# Patient Record
Sex: Male | Born: 1980 | Race: White | Hispanic: No | Marital: Married | Smoking: Current every day smoker
Health system: Southern US, Community
[De-identification: ages and names within clinical notes are randomized; demographics above are authoritative.]

## PROBLEM LIST (undated history)

## (undated) DIAGNOSIS — R079 Chest pain, unspecified: Secondary | ICD-10-CM

## (undated) DIAGNOSIS — E785 Hyperlipidemia, unspecified: Secondary | ICD-10-CM

## (undated) DIAGNOSIS — K219 Gastro-esophageal reflux disease without esophagitis: Secondary | ICD-10-CM

## (undated) DIAGNOSIS — R5383 Other fatigue: Secondary | ICD-10-CM

## (undated) HISTORY — DX: Gastro-esophageal reflux disease without esophagitis: K21.9

## (undated) HISTORY — DX: Other fatigue: R53.83

## (undated) HISTORY — DX: Hyperlipidemia, unspecified: E78.5

## (undated) HISTORY — PX: APPENDECTOMY: SHX54

## (undated) HISTORY — DX: Chest pain, unspecified: R07.9

---

## 2008-03-12 ENCOUNTER — Ambulatory Visit (HOSPITAL_COMMUNITY): Admission: RE | Admit: 2008-03-12 | Discharge: 2008-03-12 | Payer: Self-pay | Admitting: Internal Medicine

## 2008-03-13 ENCOUNTER — Inpatient Hospital Stay (HOSPITAL_COMMUNITY): Admission: RE | Admit: 2008-03-13 | Discharge: 2008-03-15 | Payer: Self-pay | Admitting: Orthopedic Surgery

## 2008-03-13 HISTORY — PX: OTHER SURGICAL HISTORY: SHX169

## 2008-06-26 ENCOUNTER — Ambulatory Visit (HOSPITAL_COMMUNITY): Admission: RE | Admit: 2008-06-26 | Discharge: 2008-06-26 | Payer: Self-pay | Admitting: Family Medicine
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
## 2011-05-09 LAB — CBC
HCT: 43
RBC: 4.65
RDW: 13.1

## 2011-05-09 LAB — BASIC METABOLIC PANEL
BUN: 7
CO2: 28
Chloride: 104
Creatinine, Ser: 0.79
Glucose, Bld: 89

## 2014-02-28 ENCOUNTER — Other Ambulatory Visit (HOSPITAL_COMMUNITY): Payer: Self-pay | Admitting: Orthopedic Surgery

## 2014-02-28 DIAGNOSIS — M25562 Pain in left knee: Secondary | ICD-10-CM

## 2014-03-02 ENCOUNTER — Ambulatory Visit (HOSPITAL_COMMUNITY)
Admission: RE | Admit: 2014-03-02 | Discharge: 2014-03-02 | Disposition: A | Discharge status: Home / Self Care | Payer: 59 | Source: Ambulatory Visit | Attending: Orthopedic Surgery | Admitting: Orthopedic Surgery

## 2014-03-02 DIAGNOSIS — R609 Edema, unspecified: Secondary | ICD-10-CM | POA: Insufficient documentation

## 2014-03-02 DIAGNOSIS — M25562 Pain in left knee: Secondary | ICD-10-CM

## 2014-03-02 DIAGNOSIS — M25569 Pain in unspecified knee: Secondary | ICD-10-CM | POA: Insufficient documentation

## 2018-07-15 ENCOUNTER — Other Ambulatory Visit (HOSPITAL_COMMUNITY): Payer: Self-pay | Admitting: Internal Medicine

## 2018-07-15 ENCOUNTER — Ambulatory Visit (HOSPITAL_COMMUNITY)
Admission: RE | Admit: 2018-07-15 | Discharge: 2018-07-15 | Disposition: A | Discharge status: Home / Self Care | Payer: BLUE CROSS/BLUE SHIELD | Source: Ambulatory Visit | Attending: Internal Medicine | Admitting: Internal Medicine

## 2018-07-15 DIAGNOSIS — R079 Chest pain, unspecified: Secondary | ICD-10-CM | POA: Insufficient documentation

## 2018-08-13 ENCOUNTER — Other Ambulatory Visit (HOSPITAL_COMMUNITY)
Admission: RE | Admit: 2018-08-13 | Discharge: 2018-08-13 | Disposition: A | Discharge status: Home / Self Care | Payer: BLUE CROSS/BLUE SHIELD | Source: Ambulatory Visit | Attending: Internal Medicine | Admitting: Internal Medicine

## 2018-08-13 ENCOUNTER — Ambulatory Visit (INDEPENDENT_AMBULATORY_CARE_PROVIDER_SITE_OTHER): Payer: BLUE CROSS/BLUE SHIELD | Admitting: Internal Medicine

## 2018-08-13 ENCOUNTER — Encounter: Payer: Self-pay | Admitting: Internal Medicine

## 2018-08-13 ENCOUNTER — Ambulatory Visit (INDEPENDENT_AMBULATORY_CARE_PROVIDER_SITE_OTHER)
Admission: RE | Admit: 2018-08-13 | Discharge: 2018-08-13 | Disposition: A | Discharge status: Home / Self Care | Payer: BLUE CROSS/BLUE SHIELD | Source: Ambulatory Visit | Attending: Internal Medicine | Admitting: Internal Medicine

## 2018-08-13 VITALS — BP 116/76 | HR 97 | Ht 72.0 in | Wt 201.0 lb

## 2018-08-13 DIAGNOSIS — R55 Syncope and collapse: Secondary | ICD-10-CM | POA: Insufficient documentation

## 2018-08-13 DIAGNOSIS — Z720 Tobacco use: Secondary | ICD-10-CM

## 2018-08-13 DIAGNOSIS — E782 Mixed hyperlipidemia: Secondary | ICD-10-CM

## 2018-08-13 LAB — LIPID PANEL
CHOL/HDL RATIO: 6 ratio
Cholesterol: 282 mg/dL — ABNORMAL HIGH (ref 0–200)
HDL: 47 mg/dL (ref >40)
LDL CALC: 177 mg/dL — AB (ref 0–99)
Triglycerides: 291 mg/dL — ABNORMAL HIGH (ref <150)
VLDL: 58 mg/dL — AB (ref 0–40)

## 2018-08-13 NOTE — Progress Notes (Signed)
Cardiology Office Note   Date:  08/13/2018   ID:  [name], DOB November 19, 1980, MRN [id number]  PCP:  Elfredia Nevins, MD  Cardiologist:   Dietrich Pates, MD   Pt referred for dizzy spell and dyslipidemia   History of Present Illness: Brent Warner is a 38 y.o. male with a history of tobacco abuse   On Dec 4 he was sitting down to dinner when he had a spell  Sitting down he suddenly felt pain in between shoulder blades  Then became very tired   Blurry vision    Did not have palpitatoins   No SOB   No CP    Did not pass out but felt bad.   Stood up and went to lay down     Father came over and listened to him   BP 135/105     HR 110s to 120    Laid down for a while   Got up later and felt OK    Watched TV and went to bed  Seen by Dr Sherwood Gambler the next day   EKG   NSR with normal conduction intervals   BP OK  Labs done  CBC and CMET OK   Lipids abnormal  LDL 189  HDL 46  Trig 241   Total chol 283        Since that episode the pt has had no furhter spells  He denies CP   He can do yard work without a problem   Not moving as fast as 10 year ago, attributes to tobacco use and not exercising   But no problems day to day or playing with his 2 kids  Smoking 1 to 1.5 ppd since age 45   Quit for 1 year   Started back because of stress       No outpatient medications have been marked as taking for the 08/13/18 encounter (Office Visit) with Pricilla Riffle, MD.     Allergies:   Tetracycline   Past Medical History:  Diagnosis Date  . Chest pain   . Fatigue   . GERD (gastroesophageal reflux disease)   . Hyperlipidemia     Past Surgical History:  Procedure Laterality Date  . APPENDECTOMY    . broken arm  03/13/2008     Social History:  The patient  reports that he has been smoking cigarettes. He has never used smokeless tobacco.   Family History:  The patient's family history includes Hypertension in his father.    ROS:  Please see the history of present illness. All other  systems are reviewed and  Negative to the above problem except as noted.    PHYSICAL EXAM: VS:  BP 116/76 (BP Location: Left Arm)   Pulse 97   Ht 6' (1.829 m)   Wt 201 lb (91.2 kg)   SpO2 98%   BMI 27.26 kg/m   GEN: Well nourished, well developed, in no acute distress  HEENT: normal  Neck: no JVD, carotid bruits, or masses Cardiac: RRR; no murmurs, rubs, or gallops,no edema  Respiratory:  clear to auscultation bilaterally, normal work of breathing GI: soft, nontender, nondistended, + BS  No hepatomegaly  MS: no deformity Moving all extremities   Skin: warm and dry, no rash Neuro:  Strength and sensation are intact Psych: euthymic mood, full affect   EKG:  EKG is not  ordered today.  See above    Lipid Panel No results found for: CHOL, TRIG, HDL,  CHOLHDL, VLDL, LDLCALC, LDLDIRECT    Wt Readings from Last 3 Encounters:  08/13/18 201 lb (91.2 kg)      ASSESSMENT AND PLAN:  1 Presyncope    History does not suggest an arrhyhtmia   I am not convinced of anginal equivalent   Sounds like vagal episode in setting of transient pain  WIll perform risk assessment    2   HL  Will repeat labs to verify    3  Tobacco  Counselled on cessation     4   Cardiac risk assessment   Pt with very high lipids and long hx of smoking    WOuld recomm Cardiac CT Calcium score to eval for plaqu burden     F/U based on test results      Current medicines are reviewed at length with the patient today.  The patient does not have concerns regarding medicines.  Signed, Dietrich Pates, MD  08/13/2018 9:30 AM    [facility] [address] [place], [place], [place]  [address] Phone: [phone]; Fax: [phone]

## 2018-08-13 NOTE — Patient Instructions (Signed)
Medication Instructions:  Your physician recommends that you continue on your current medications as directed. Please refer to the Current Medication list given to you today.  If you need a refill on your cardiac medications before your next appointment, please call your pharmacy.   Lab work: Your physician recommends that you return for lab work in: today   If you have labs (blood work) drawn today and your tests are completely normal, you will receive your results only by: Marland Kitchen MyChart Message (if you have MyChart) OR . A paper copy in the mail If you have any lab test that is abnormal or we need to change your treatment, we will call you to review the results.  Testing/Procedures: Non-Cardiac CT scanning, (CAT scanning), is a noninvasive, special x-ray that produces cross-sectional images of the body using x-rays and a computer. CT scans help physicians diagnose and treat medical conditions. For some CT exams, a contrast material is used to enhance visibility in the area of the body being studied. CT scans provide greater clarity and reveal more details than regular x-ray exams.    Follow-Up: At [facility], you and your health needs are our priority.  As part of our continuing mission to provide you with exceptional heart care, we have created designated Provider Care Teams.  These Care Teams include your primary Cardiologist (physician) and Advanced Practice Providers (APPs -  Physician Assistants and Nurse Practitioners) who all work together to provide you with the care you need, when you need it. You will need a follow up appointment pending test results .  Please call our office 2 months in advance to schedule this appointment.  You may see Dietrich Pates, MD or one of the following Advanced Practice Providers on your designated Care Team:   Randall An, PA-C [facility]) . Jacolyn Reedy, PA-C [facility] Office)  Any Other Special Instructions Will Be Listed Below (If  Applicable). Thank you for choosing [facility]!

## 2018-08-16 ENCOUNTER — Telehealth: Payer: Self-pay | Admitting: *Deleted

## 2018-08-16 MED ORDER — ROSUVASTATIN CALCIUM 10 MG PO TABS: 10.0000 mg | ORAL_TABLET | Freq: Every day | ORAL | 3 refills | Status: DC

## 2018-08-16 NOTE — Telephone Encounter (Signed)
-----   Message from Nori Riis, RN sent at 08/16/2018  7:40 AM EST -----  ----- Message ----- From: Pricilla Riffle, MD Sent: 08/13/2018  10:02 PM EST To: Nori Riis, RN  LDL is high at 177 Triglycerides 291 Cut back on saturated fats and sugars Start Crestor 10 mg  I have called this in to [facility] pharmacy Pt will need fasting lipids in 8 wks with AST    Fax results along with CT to Dr Sherwood Gambler

## 2018-08-16 NOTE — Telephone Encounter (Signed)
Orders placed.

## 2018-08-24 ENCOUNTER — Other Ambulatory Visit: Payer: BLUE CROSS/BLUE SHIELD

## 2018-12-09 ENCOUNTER — Telehealth: Payer: Self-pay | Admitting: Internal Medicine

## 2018-12-09 DIAGNOSIS — E782 Mixed hyperlipidemia: Secondary | ICD-10-CM

## 2018-12-09 NOTE — Telephone Encounter (Signed)
Set patient to get llpid panel and liver panel at [facility] when order is in.

## 2018-12-10 NOTE — Telephone Encounter (Signed)
Pt notified and voiced understanding 

## 2018-12-10 NOTE — Addendum Note (Signed)
Addended by: Kerney Elbe on: 12/10/2018 04:54 PM   Modules accepted: Orders

## 2018-12-14 LAB — HEPATIC FUNCTION PANEL
ALT: 25 IU/L (ref 0–44)
AST: 25 IU/L (ref 0–40)
Albumin: 4.5 g/dL (ref 4.0–5.0)
Alkaline Phosphatase: 81 IU/L (ref 39–117)
Bilirubin Total: 0.4 mg/dL (ref 0.0–1.2)
Bilirubin, Direct: 0.13 mg/dL (ref 0.00–0.40)
Total Protein: 6.7 g/dL (ref 6.0–8.5)

## 2018-12-14 LAB — LIPID PANEL
Chol/HDL Ratio: 3.4 ratio (ref 0.0–5.0)
Cholesterol, Total: 169 mg/dL (ref 100–199)
HDL: 49 mg/dL (ref >39)
LDL Calculated: 103 mg/dL — ABNORMAL HIGH (ref 0–99)
Triglycerides: 87 mg/dL (ref 0–149)
VLDL Cholesterol Cal: 17 mg/dL (ref 5–40)

## 2019-01-11 IMAGING — CT CT HEART SCORING
2 series · 16 of 20 positions shown, 18 images · non-contrast
Comparison: Chest radiograph 07/15/2018.  Chest CT 03/12/2008

Addendum:
EXAM:
OVER-READ INTERPRETATION  CT CHEST

The following report is an over-read performed by radiologist Dr.
Haamza Gabard [REDACTED] on 08/13/2018. This over-read
does not include interpretation of cardiac or coronary anatomy or
pathology. The coronary calcium score/coronary CTA interpretation by
the cardiologist is attached.
CLINICAL DATA: Risk stratification
Coronary Calcium Score
TECHNIQUE: Axial non-contrast 3mm slices were carried out through the heart.
The data set was analyzed on a dedicated work station and scored
using the Agatson method.

[Series 3: casc 3.0 i36f 2 bestdiast 76 % · axial · 0.31mm/px · z∈[+1341,+1434]mm · 8 of 41 slices shown, 10 images]
[im 5/41  vessel]
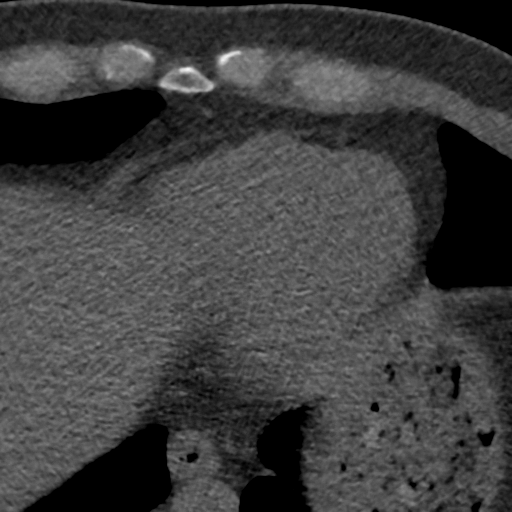
[im 5/41  lung]
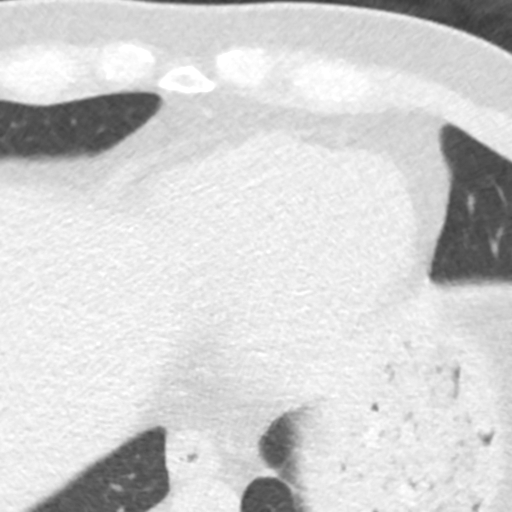
[im 9/41  vessel]
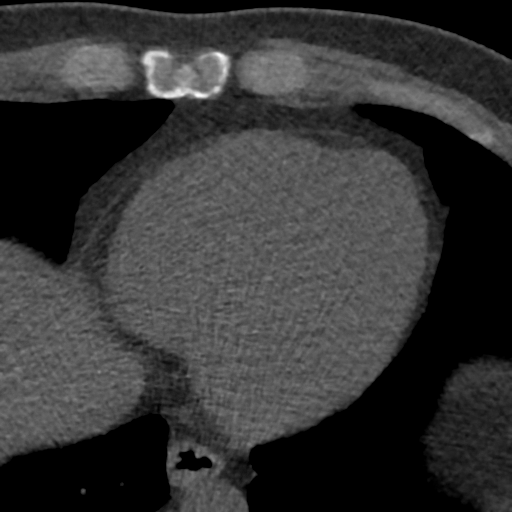
[im 14/41  vessel]
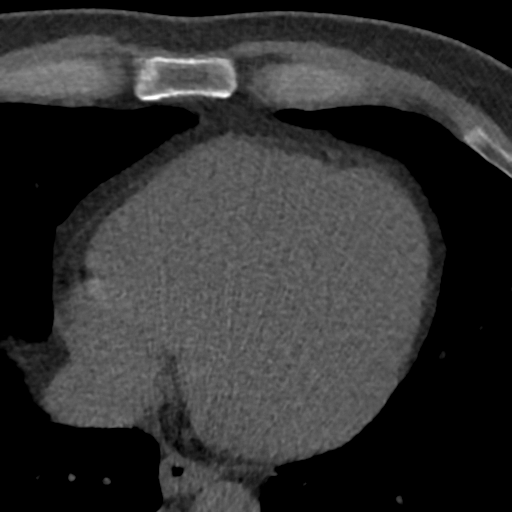
[im 18/41  vessel]
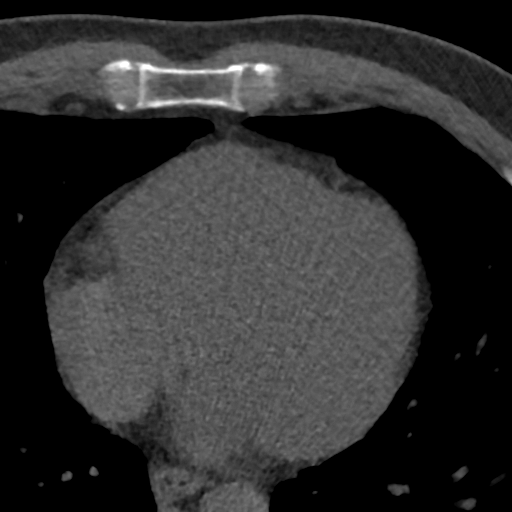
[im 23/41  vessel]
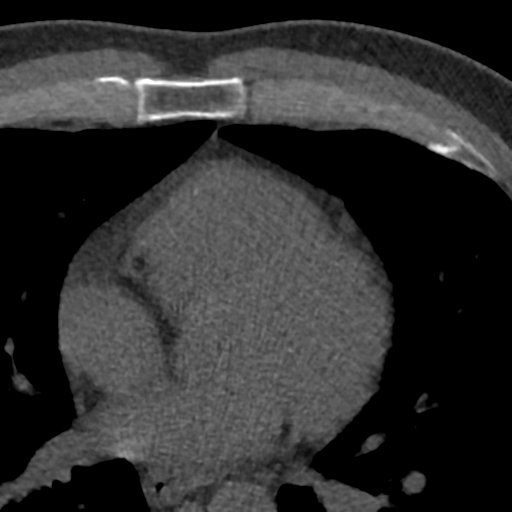
[im 23/41  lung]
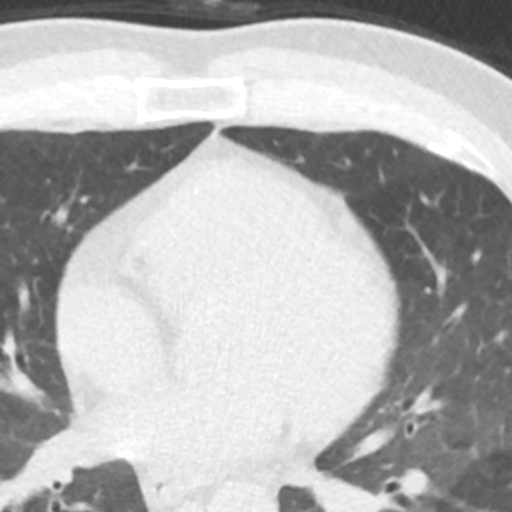
[im 27/41  vessel]
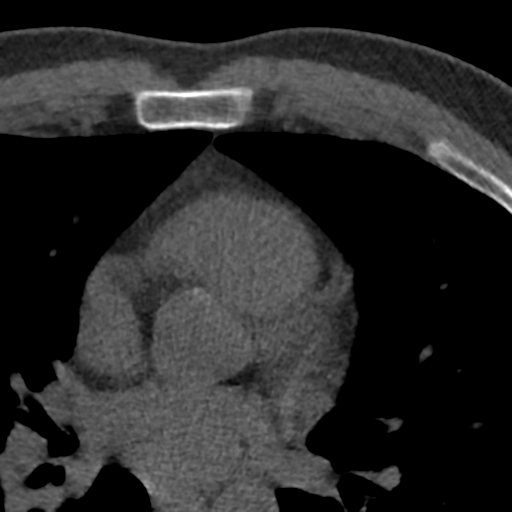
[im 32/41  vessel]
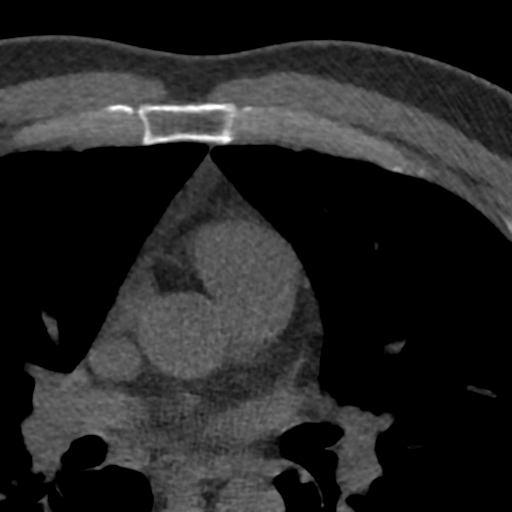
[im 36/41  vessel]
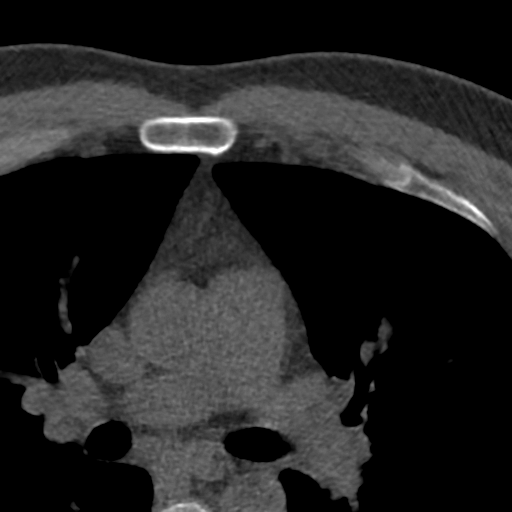

[Series 5: lung st 76 % · axial · 0.66mm/px · z∈[+1330,+1434]mm · 8 of 45 slices shown]
[im 5/45  lung]
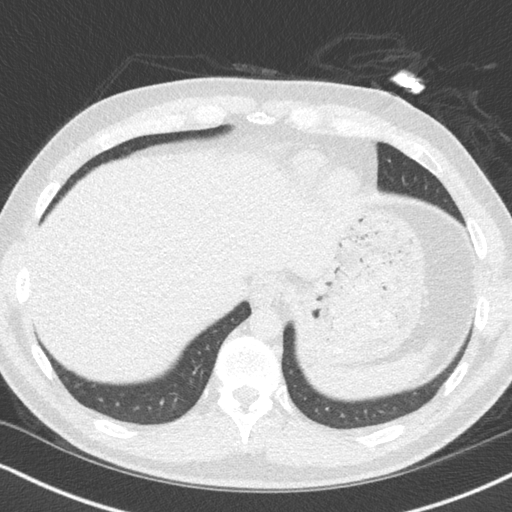
[im 10/45  lung]
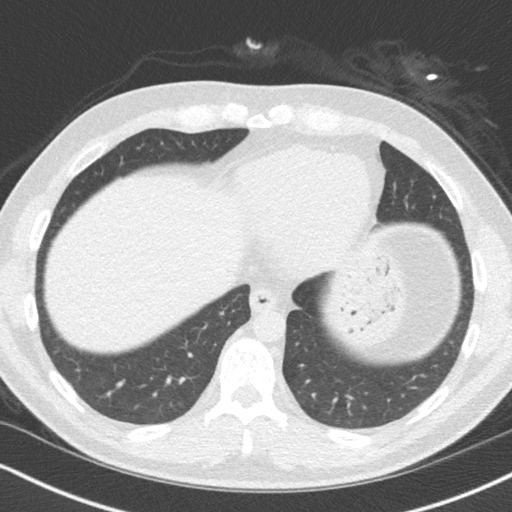
[im 15/45  lung]
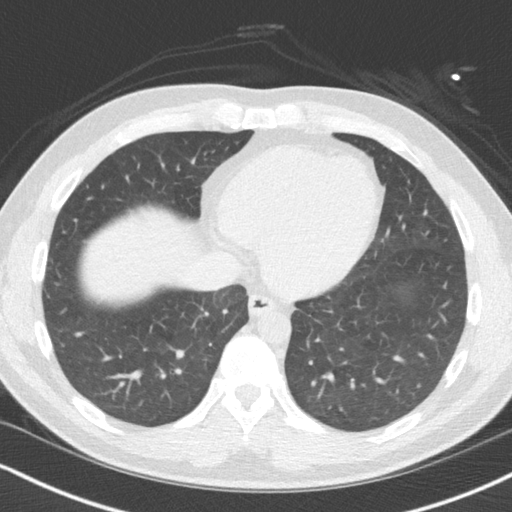
[im 20/45  lung]
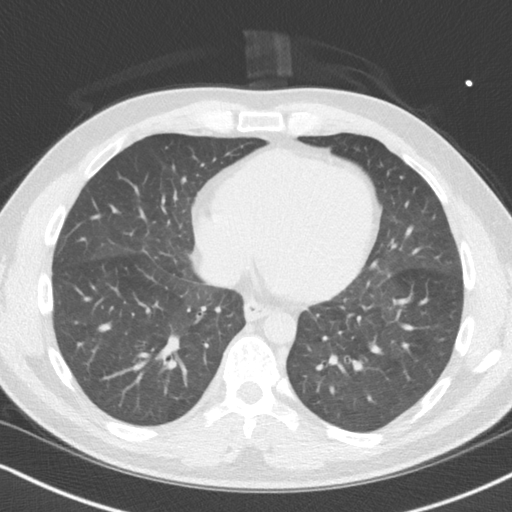
[im 25/45  lung]
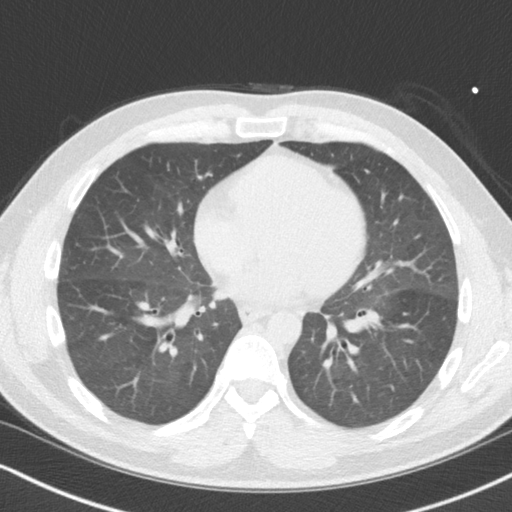
[im 30/45  lung]
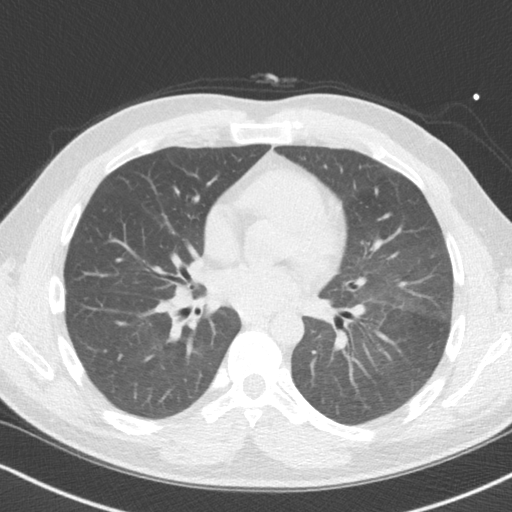
[im 35/45  lung]
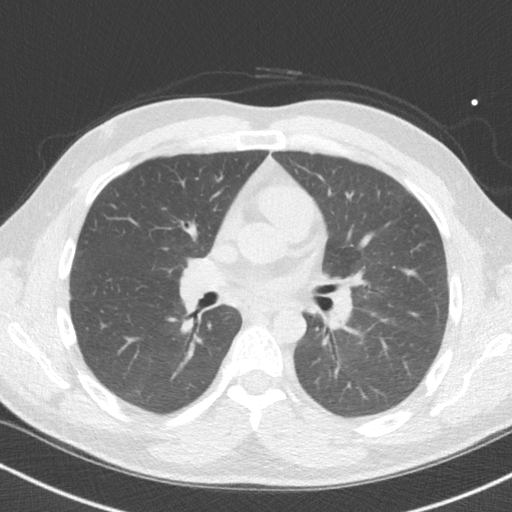
[im 40/45  lung]
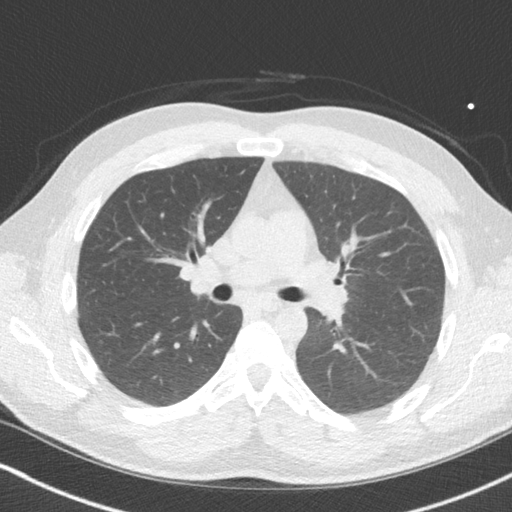

[16 of 20 positions shown; findings below may reference images not displayed]

FINDINGS: Vascular: Normal caliber of the visualized thoracic aorta. Heart
size is normal without significant pericardial fluid.

Mediastinum/Nodes: Mediastinal structures are unremarkable.

Lungs/Pleura: No large pleural effusions. Punctate nodular density
in the left lower lobe measures 3 mm on sequence 4, image 31. This
punctate nodule appears to be a chronic since 3770 and compatible
with a benign finding. Mild haziness in the central aspect of the
left lung probably represent some atelectasis. No significant
consolidation.

Upper Abdomen: Visualized abdominal structures are normal.

Musculoskeletal: Unremarkable.
IMPRESSION: Negative over-read examination.
FINDINGS: Non-cardiac: See separate report from [REDACTED].

Coronary arteries: 0.6 Agatston units.
IMPRESSION: Coronary calcium score of 0.6 Agatston units. This was 75th
percentile for age and sex matched control.

Andre Kula

*** End of Addendum ***

## 2019-05-31 ENCOUNTER — Other Ambulatory Visit: Payer: Self-pay

## 2019-05-31 DIAGNOSIS — Z20822 Contact with and (suspected) exposure to covid-19: Secondary | ICD-10-CM

## 2019-06-02 LAB — NOVEL CORONAVIRUS, NAA: SARS-CoV-2, NAA: NOT DETECTED

## 2019-07-02 ENCOUNTER — Other Ambulatory Visit: Payer: Self-pay

## 2019-07-02 DIAGNOSIS — Z20822 Contact with and (suspected) exposure to covid-19: Secondary | ICD-10-CM

## 2019-07-04 LAB — NOVEL CORONAVIRUS, NAA: SARS-CoV-2, NAA: NOT DETECTED

## 2019-07-12 ENCOUNTER — Other Ambulatory Visit: Payer: Self-pay

## 2019-07-12 DIAGNOSIS — Z20822 Contact with and (suspected) exposure to covid-19: Secondary | ICD-10-CM

## 2019-07-13 LAB — NOVEL CORONAVIRUS, NAA: SARS-CoV-2, NAA: NOT DETECTED

## 2020-04-17 ENCOUNTER — Other Ambulatory Visit: Payer: BC Managed Care – PPO

## 2020-04-17 ENCOUNTER — Other Ambulatory Visit: Payer: Self-pay

## 2020-04-17 DIAGNOSIS — Z20822 Contact with and (suspected) exposure to covid-19: Secondary | ICD-10-CM

## 2020-04-18 LAB — NOVEL CORONAVIRUS, NAA: SARS-CoV-2, NAA: NOT DETECTED

## 2020-04-24 ENCOUNTER — Other Ambulatory Visit: Payer: BC Managed Care – PPO

## 2020-04-24 ENCOUNTER — Other Ambulatory Visit: Payer: Self-pay

## 2020-04-24 DIAGNOSIS — Z20822 Contact with and (suspected) exposure to covid-19: Secondary | ICD-10-CM

## 2020-04-26 LAB — SARS-COV-2, NAA 2 DAY TAT

## 2020-04-26 LAB — NOVEL CORONAVIRUS, NAA: SARS-CoV-2, NAA: NOT DETECTED

## 2020-05-01 ENCOUNTER — Other Ambulatory Visit: Payer: BC Managed Care – PPO

## 2020-05-01 ENCOUNTER — Other Ambulatory Visit: Payer: Self-pay

## 2020-05-01 DIAGNOSIS — Z20822 Contact with and (suspected) exposure to covid-19: Secondary | ICD-10-CM

## 2020-05-04 LAB — NOVEL CORONAVIRUS, NAA: SARS-CoV-2, NAA: NOT DETECTED

## 2020-05-08 ENCOUNTER — Other Ambulatory Visit: Payer: BC Managed Care – PPO

## 2020-05-08 DIAGNOSIS — Z20822 Contact with and (suspected) exposure to covid-19: Secondary | ICD-10-CM

## 2020-05-09 LAB — NOVEL CORONAVIRUS, NAA: SARS-CoV-2, NAA: NOT DETECTED

## 2020-05-09 LAB — SARS-COV-2, NAA 2 DAY TAT

## 2020-05-15 ENCOUNTER — Other Ambulatory Visit: Payer: BC Managed Care – PPO

## 2020-05-15 DIAGNOSIS — Z20822 Contact with and (suspected) exposure to covid-19: Secondary | ICD-10-CM

## 2020-05-16 LAB — NOVEL CORONAVIRUS, NAA: SARS-CoV-2, NAA: NOT DETECTED

## 2020-05-16 LAB — SARS-COV-2, NAA 2 DAY TAT

## 2020-05-22 ENCOUNTER — Other Ambulatory Visit: Payer: BC Managed Care – PPO

## 2020-05-22 ENCOUNTER — Other Ambulatory Visit: Payer: Self-pay

## 2020-05-22 DIAGNOSIS — Z20822 Contact with and (suspected) exposure to covid-19: Secondary | ICD-10-CM

## 2020-05-23 LAB — NOVEL CORONAVIRUS, NAA: SARS-CoV-2, NAA: NOT DETECTED

## 2020-05-23 LAB — SARS-COV-2, NAA 2 DAY TAT

## 2020-05-29 ENCOUNTER — Other Ambulatory Visit: Payer: BC Managed Care – PPO

## 2020-05-29 DIAGNOSIS — Z20822 Contact with and (suspected) exposure to covid-19: Secondary | ICD-10-CM

## 2020-05-30 LAB — NOVEL CORONAVIRUS, NAA: SARS-CoV-2, NAA: NOT DETECTED

## 2020-05-30 LAB — SARS-COV-2, NAA 2 DAY TAT

## 2020-06-05 ENCOUNTER — Other Ambulatory Visit: Payer: BC Managed Care – PPO

## 2020-06-12 ENCOUNTER — Other Ambulatory Visit: Payer: BC Managed Care – PPO

## 2020-06-12 DIAGNOSIS — Z20822 Contact with and (suspected) exposure to covid-19: Secondary | ICD-10-CM

## 2020-06-13 LAB — SARS-COV-2, NAA 2 DAY TAT

## 2020-06-13 LAB — NOVEL CORONAVIRUS, NAA: SARS-CoV-2, NAA: NOT DETECTED

## 2020-06-19 ENCOUNTER — Other Ambulatory Visit: Payer: BC Managed Care – PPO

## 2020-06-19 DIAGNOSIS — Z20822 Contact with and (suspected) exposure to covid-19: Secondary | ICD-10-CM

## 2020-06-21 LAB — NOVEL CORONAVIRUS, NAA

## 2020-06-26 ENCOUNTER — Other Ambulatory Visit: Payer: BC Managed Care – PPO

## 2020-06-26 DIAGNOSIS — Z20822 Contact with and (suspected) exposure to covid-19: Secondary | ICD-10-CM

## 2020-06-28 LAB — SARS-COV-2, NAA 2 DAY TAT

## 2020-06-28 LAB — NOVEL CORONAVIRUS, NAA: SARS-CoV-2, NAA: NOT DETECTED

## 2020-07-03 ENCOUNTER — Other Ambulatory Visit: Payer: BC Managed Care – PPO

## 2020-07-03 DIAGNOSIS — Z20822 Contact with and (suspected) exposure to covid-19: Secondary | ICD-10-CM

## 2020-07-04 LAB — SARS-COV-2, NAA 2 DAY TAT

## 2020-07-04 LAB — NOVEL CORONAVIRUS, NAA: SARS-CoV-2, NAA: NOT DETECTED

## 2020-07-10 ENCOUNTER — Other Ambulatory Visit: Payer: BC Managed Care – PPO

## 2020-07-10 DIAGNOSIS — Z20822 Contact with and (suspected) exposure to covid-19: Secondary | ICD-10-CM

## 2020-07-12 LAB — NOVEL CORONAVIRUS, NAA

## 2020-07-17 ENCOUNTER — Other Ambulatory Visit: Payer: BC Managed Care – PPO

## 2020-07-24 ENCOUNTER — Other Ambulatory Visit: Payer: BC Managed Care – PPO

## 2020-07-24 DIAGNOSIS — Z20822 Contact with and (suspected) exposure to covid-19: Secondary | ICD-10-CM

## 2020-07-25 LAB — NOVEL CORONAVIRUS, NAA: SARS-CoV-2, NAA: NOT DETECTED

## 2020-07-25 LAB — SARS-COV-2, NAA 2 DAY TAT

## 2020-07-31 ENCOUNTER — Other Ambulatory Visit: Payer: BC Managed Care – PPO

## 2020-07-31 DIAGNOSIS — Z20822 Contact with and (suspected) exposure to covid-19: Secondary | ICD-10-CM

## 2020-08-02 LAB — SARS-COV-2, NAA 2 DAY TAT

## 2020-08-02 LAB — NOVEL CORONAVIRUS, NAA: SARS-CoV-2, NAA: NOT DETECTED

## 2021-07-31 ENCOUNTER — Telehealth: Payer: Self-pay

## 2021-07-31 NOTE — Telephone Encounter (Signed)
NOTES SCANNED TO REFERRAL 

## 2021-08-22 NOTE — Progress Notes (Signed)
Office Visit    Patient Name: Brent Warner Date of Encounter: 08/22/2021  PCP:  [facility]  Cardiologist:  Dorris Carnes, MD  Advanced Practice Provider:  No care team member to display Electrophysiologist:  None      Chief Complaint    Brent Warner is a 41 y.o. male with a hx of tobacco use, CAD, chest pain presents today for chest pain  Past Medical History    Past Medical History:  Diagnosis Date   Chest pain    Fatigue    GERD (gastroesophageal reflux disease)    Hyperlipidemia    Past Surgical History:  Procedure Laterality Date   APPENDECTOMY     broken arm  03/13/2008    Allergies  Allergies  Allergen Reactions   Tetracycline Anaphylaxis    Throat swelling    History of Present Illness    Brent Warner is a 41 y.o. male with a hx of tobacco use, CAD, chest pain last seen 08/13/2018 by Dr. Harrington Challenger.  Initially evaluated 08/12/2018 by Dr. Harrington Challenger due to episode of pain between his shoulders, fatigue, blurry vision.  Presyncope with history not suggestive of arrhythmia.  Was thought to be vagal episode.  CT cardiac scoring 08/12/2018 with coronary calcium score of 0.6 placing him in the 75th percentile for age and sex matched control. Crestor 10mg  QD was added.  He presents today for follow-up. 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 The two episodes were 1 month apart. They were midsternal. Described as pressure and felt clammy. Felt different than his previous lightheaded spell. Reports no recurrent chest pain. He is able to split wood and hunt without angina. He feels symptoms were related to indigestion. He does take Omeprazole daily and additional Tums as needed. He has not had recurrent symptoms for 1 month. We discussed possible cardiac CTA but as symptoms have resolved he understandable prefers to defer.   EKGs/Labs/Other Studies Reviewed:   The  following studies were reviewed today:   EKG:  EKG is  ordered today.  The ekg ordered today demonstrates NSR 82 bpm with sinus arrhythmia. No acute St/T wave changes.   Recent Labs: No results found for requested labs within last 8760 hours.  Recent Lipid Panel    Component Value Date/Time   CHOL 169 12/13/2018 0907   TRIG 87 12/13/2018 0907   HDL 49 12/13/2018 0907   CHOLHDL 3.4 12/13/2018 0907   CHOLHDL 6.0 08/13/2018 1046   VLDL 58 (H) 08/13/2018 1046   LDLCALC 103 (H) 12/13/2018 0907     Home Medications   No outpatient medications have been marked as taking for the 08/23/21 encounter (Appointment) with Loel Dubonnet, NP.     Review of Systems      All other systems reviewed and are otherwise negative except as noted above.  Physical Exam    VS:  There were no vitals taken for this visit. , BMI There is no height or weight on file to calculate BMI.  Wt Readings from Last 3 Encounters:  08/13/18 201 lb (91.2 kg)     GEN: Well nourished, well developed, in no acute distress. HEENT: normal. Neck: Supple, no JVD, carotid bruits, or masses. Cardiac: RRR, no murmurs, rubs, or gallops. No clubbing, cyanosis, edema.  Radials/PT 2+ and equal bilaterally.  Respiratory:  Respirations regular and unlabored, clear to auscultation bilaterally.  GI: Soft, nontender, nondistended. MS: No deformity or atrophy. Skin: Warm and dry, no rash. Neuro:  Strength and sensation are intact. Psych: Normal affect.  Assessment & Plan    CAD - 08/13/18 coronary calcium score of 0.6. 75th percentile for age/sex. Two episdoes of chest pain, most recent one month ago, midsternal occurred at rest described as "pressure". No exertional symptoms. EKG today with no acute ST/T wave changes. Atypical for anginal. Discussed possible cardiac CTA and given he is now asymptomatic he understandably prefers to defer. He will contact us if symptoms recur and consider cardiac CTA. GDMT includes aspirin,  crestor. Will increase Crestor to get LDL to goal. Heart healthy diet and regular cardiovascular exercise encouraged.    Tobacco use - Smoking cessation encouraged. Recommend utilization of 1800QUITNOW.   Hyperlipidemia, LDL goal less than 70 - Recent LDL not at goal per his report. Increase Crestor to 20mg  daily with repeat FLP, CMP in 6-8 weeks. Lipid lowering diet encouraged.   Disposition: Follow up in 1 year(s) with Dorris Carnes, MD or APP.  Signed, Loel Dubonnet, NP 08/22/2021, 5:04 PM [place]

## 2021-08-23 ENCOUNTER — Ambulatory Visit (INDEPENDENT_AMBULATORY_CARE_PROVIDER_SITE_OTHER): Payer: 59 | Admitting: Family

## 2021-08-23 ENCOUNTER — Other Ambulatory Visit: Payer: Self-pay

## 2021-08-23 ENCOUNTER — Encounter (HOSPITAL_BASED_OUTPATIENT_CLINIC_OR_DEPARTMENT_OTHER): Payer: Self-pay | Admitting: Family

## 2021-08-23 VITALS — BP 120/68 | HR 82 | Ht 72.0 in | Wt 195.9 lb

## 2021-08-23 DIAGNOSIS — E785 Hyperlipidemia, unspecified: Secondary | ICD-10-CM

## 2021-08-23 DIAGNOSIS — Z720 Tobacco use: Secondary | ICD-10-CM | POA: Diagnosis not present

## 2021-08-23 DIAGNOSIS — I25118 Atherosclerotic heart disease of native coronary artery with other forms of angina pectoris: Secondary | ICD-10-CM

## 2021-08-23 MED ORDER — ROSUVASTATIN CALCIUM 20 MG PO TABS: 20.0000 mg | ORAL_TABLET | Freq: Every day | ORAL | 3 refills | Status: DC

## 2021-08-23 NOTE — Patient Instructions (Addendum)
Medication Instructions:  Your physician has recommended you make the following change in your medication:   CHANGE your Rosuvastatin to 20mg  daily  Our goal is to get your LDL (Bad cholesterol) to less than 70.  *If you need a refill on your cardiac medications before your next appointment, please call your pharmacy*   Lab Work: Your physician recommends that you return for lab work in 6-8 weeks for fasting lipid panel and CMET.  If you have labs (blood work) drawn today and your tests are completely normal, you will receive your results only by: MyChart Message (if you have MyChart) OR A paper copy in the mail If you have any lab test that is abnormal or we need to change your treatment, we will call you to review the results.   Testing/Procedures: Your EKG today showed normal sinus rhythm.  This is a good result!   Follow-Up: At [facility], you and your health needs are our priority.  As part of our continuing mission to provide you with exceptional heart care, we have created designated Provider Care Teams.  These Care Teams include your primary Cardiologist (physician) and Advanced Practice Providers (APPs -  Physician Assistants and Nurse Practitioners) who all work together to provide you with the care you need, when you need it.  We recommend signing up for the patient portal called "MyChart".  Sign up information is provided on this After Visit Summary.  MyChart is used to connect with patients for Virtual Visits (Telemedicine).  Patients are able to view lab/test results, encounter notes, upcoming appointments, etc.  Non-urgent messages can be sent to your provider as well.   To learn more about what you can do with MyChart, go to [facility].    Your next appointment:   1 year  The format for your next appointment:   In Person  Provider:   [email or web address], MD     Other Instructions  Heart Healthy Diet Recommendations: A low-salt diet is recommended.  Meats should be grilled, baked, or boiled. Avoid fried foods. Focus on lean protein sources like fish or chicken with vegetables and fruits. The American Heart Association is a Dietrich Pates!  American Heart Association Diet and Lifeystyle Recommendations    Exercise recommendations: The American Heart Association recommends 150 minutes of moderate intensity exercise weekly. Try 30 minutes of moderate intensity exercise 4-5 times per week. This could include walking, jogging, or swimming.    For coronary artery disease often called "heart disease" we aim for optimal guideline directed medical therapy. We use the "A, B, C"s to help keep Chief Technology Officer on track!  A = Aspirin 81mg  daily B = Blood pressure control. C = Cholesterol control. You take Rosuvastatin to help control your cholesterol.

## 2021-08-26 NOTE — Addendum Note (Signed)
Addended by: Monserrath Junio, Danicka Hourihan M on: 08/26/2021 08:53 AM ° ° Modules accepted: Orders ° °

## 2021-08-29 ENCOUNTER — Telehealth: Payer: Self-pay | Admitting: Family

## 2021-08-29 NOTE — Telephone Encounter (Signed)
Called Brent Warner to him inform him that his son would be have be seen as a new patient by an MD

## 2021-08-29 NOTE — Telephone Encounter (Signed)
Dr. Caron Presume would like to speak to you about his son, he would like for him to have a stress test done.

## 2021-08-29 NOTE — Telephone Encounter (Signed)
Patient will need to be seen as a new patient by an MD for consideration of stress test.  Loel Dubonnet, NP

## 2022-09-12 ENCOUNTER — Other Ambulatory Visit (HOSPITAL_BASED_OUTPATIENT_CLINIC_OR_DEPARTMENT_OTHER): Payer: Self-pay | Admitting: Family

## 2022-09-12 DIAGNOSIS — E785 Hyperlipidemia, unspecified: Secondary | ICD-10-CM

## 2022-09-12 NOTE — Telephone Encounter (Signed)
This is a [place] pt.  °

## 2022-09-12 NOTE — Telephone Encounter (Signed)
Patient of Dr. Ross. Please review for refill. Thank you!  

## 2022-09-26 ENCOUNTER — Other Ambulatory Visit (HOSPITAL_COMMUNITY): Payer: Self-pay | Admitting: Internal Medicine

## 2022-09-26 DIAGNOSIS — R059 Cough, unspecified: Secondary | ICD-10-CM

## 2022-09-30 ENCOUNTER — Ambulatory Visit (HOSPITAL_COMMUNITY)
Admission: RE | Admit: 2022-09-30 | Discharge: 2022-09-30 | Disposition: A | Discharge status: Home / Self Care | Payer: 59 | Source: Ambulatory Visit | Attending: Internal Medicine | Admitting: Internal Medicine

## 2022-09-30 DIAGNOSIS — R059 Cough, unspecified: Secondary | ICD-10-CM | POA: Diagnosis present

## 2022-12-02 ENCOUNTER — Other Ambulatory Visit: Payer: Self-pay | Admitting: Internal Medicine

## 2022-12-02 DIAGNOSIS — E785 Hyperlipidemia, unspecified: Secondary | ICD-10-CM

## 2022-12-15 ENCOUNTER — Ambulatory Visit: Payer: 59 | Admitting: Internal Medicine

## 2023-08-07 ENCOUNTER — Other Ambulatory Visit (HOSPITAL_COMMUNITY): Payer: Self-pay | Admitting: Family Medicine

## 2023-08-07 ENCOUNTER — Ambulatory Visit (HOSPITAL_COMMUNITY)
Admission: RE | Admit: 2023-08-07 | Discharge: 2023-08-07 | Disposition: A | Discharge status: Home / Self Care | Payer: 59 | Source: Ambulatory Visit | Attending: Family Medicine | Admitting: Family Medicine

## 2023-08-07 DIAGNOSIS — R051 Acute cough: Secondary | ICD-10-CM | POA: Diagnosis present

## 2023-09-22 DIAGNOSIS — Z1331 Encounter for screening for depression: Secondary | ICD-10-CM | POA: Diagnosis not present

## 2023-09-22 DIAGNOSIS — J45909 Unspecified asthma, uncomplicated: Secondary | ICD-10-CM | POA: Diagnosis not present

## 2023-09-22 DIAGNOSIS — Z6825 Body mass index (BMI) 25.0-25.9, adult: Secondary | ICD-10-CM | POA: Diagnosis not present

## 2023-09-22 DIAGNOSIS — E7849 Other hyperlipidemia: Secondary | ICD-10-CM | POA: Diagnosis not present

## 2023-09-22 DIAGNOSIS — Z0001 Encounter for general adult medical examination with abnormal findings: Secondary | ICD-10-CM | POA: Diagnosis not present

## 2023-09-22 DIAGNOSIS — Z9229 Personal history of other drug therapy: Secondary | ICD-10-CM | POA: Diagnosis not present

## 2023-09-22 DIAGNOSIS — Z125 Encounter for screening for malignant neoplasm of prostate: Secondary | ICD-10-CM | POA: Diagnosis not present

## 2023-09-22 DIAGNOSIS — E782 Mixed hyperlipidemia: Secondary | ICD-10-CM | POA: Diagnosis not present

## 2023-09-22 DIAGNOSIS — E663 Overweight: Secondary | ICD-10-CM | POA: Diagnosis not present

## 2024-03-07 DIAGNOSIS — M79642 Pain in left hand: Secondary | ICD-10-CM | POA: Diagnosis not present

## 2024-03-07 DIAGNOSIS — R200 Anesthesia of skin: Secondary | ICD-10-CM | POA: Diagnosis not present

## 2024-03-07 DIAGNOSIS — G5632 Lesion of radial nerve, left upper limb: Secondary | ICD-10-CM | POA: Diagnosis not present

## 2024-04-18 DIAGNOSIS — Z4789 Encounter for other orthopedic aftercare: Secondary | ICD-10-CM | POA: Diagnosis not present
